# Patient Record
Sex: Female | Born: 1995 | Race: White | Hispanic: No | Marital: Married | Smoking: Never smoker
Health system: Southern US, Community
[De-identification: ages and names within clinical notes are randomized; demographics above are authoritative.]

## PROBLEM LIST (undated history)

## (undated) DIAGNOSIS — D509 Iron deficiency anemia, unspecified: Secondary | ICD-10-CM

## (undated) DIAGNOSIS — G441 Vascular headache, not elsewhere classified: Secondary | ICD-10-CM

## (undated) HISTORY — DX: Iron deficiency anemia, unspecified: D50.9

## (undated) HISTORY — PX: TONSILLECTOMY: SUR1361

## (undated) HISTORY — DX: Vascular headache, not elsewhere classified: G44.1

---

## 2018-01-20 DIAGNOSIS — F3174 Bipolar disorder, in full remission, most recent episode manic: Secondary | ICD-10-CM | POA: Insufficient documentation

## 2019-05-05 DIAGNOSIS — D509 Iron deficiency anemia, unspecified: Secondary | ICD-10-CM | POA: Insufficient documentation

## 2019-05-05 DIAGNOSIS — G441 Vascular headache, not elsewhere classified: Secondary | ICD-10-CM | POA: Insufficient documentation

## 2019-07-31 ENCOUNTER — Encounter: Payer: Self-pay | Admitting: Neurology
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
## 2019-09-12 ENCOUNTER — Encounter: Payer: Self-pay | Admitting: Neurology

## 2019-09-12 ENCOUNTER — Ambulatory Visit (INDEPENDENT_AMBULATORY_CARE_PROVIDER_SITE_OTHER): Payer: 59 | Admitting: Neurology

## 2019-09-12 ENCOUNTER — Other Ambulatory Visit: Payer: Self-pay

## 2019-09-12 VITALS — BP 106/68 | HR 96 | Ht 67.0 in | Wt 171.0 lb

## 2019-09-12 DIAGNOSIS — R519 Headache, unspecified: Secondary | ICD-10-CM | POA: Diagnosis not present

## 2019-09-12 DIAGNOSIS — G44039 Episodic paroxysmal hemicrania, not intractable: Secondary | ICD-10-CM

## 2019-09-12 MED ORDER — SUMATRIPTAN SUCCINATE 100 MG PO TABS: ORAL_TABLET | ORAL | 2 refills | Status: DC

## 2019-09-12 MED ORDER — TOPIRAMATE 50 MG PO TABS: ORAL_TABLET | ORAL | 0 refills | Status: DC

## 2019-09-12 NOTE — Patient Instructions (Addendum)
Medication Instructions:  1.  Start topiramate 50mg  tablet.  Take 1/2 tablet at bedtime for a week, then 1 tablet at bedtime.  Contact me for update and refill and we can increase dose if needed. 2.  At earliest onset of headache, take sumatriptan 100mg .  May repeat dose in 2 hours if needed.  Maximum 2 tablets in 24 hours. 3.  Limit use of pain relievers to no more than 2 days out of week to prevent risk of rebound or medication-overuse headache.  *If you need a refill on your neurolgic medications before your next appointment, please call your pharmacy*  Testing/Procedures:  Will check MRI of brain with and without contrast, once approved through your insurance [facility] will contact you to schedule an appointment   Follow up: Your next appointment:   Can be in office or virtual your choice Your physician recommends that you schedule a follow-up appointment in: 4 months with Dr Tomi Likens   Other: Headaches may be cluster headaches or paroxysmal hemicrania (both are related)   Indomethacin-Responsive Headache, Adult An indomethacin-responsive headache is a headache that gets better when you take indomethacin. Indomethacin is a kind of NSAID (nonsteroidal anti-inflammatory drug). Indomethacin can quickly stop the pain from some kinds of headaches, such as:  Paroxysmal hemicrania. This is a series of short, severe headaches, usually on just one side of the head.  Hemicrania continua. Pain is nonstop and on one side of the face.  Primary exertional headache. Exercise sets off these headaches.  Primary cough headache. Pain may come from pressure in the brain when coughing or straining. What are the causes? The exact cause of this condition is not known. Certain conditions may start (trigger) a headache. They include:  Moving the head in certain ways.  Stress.  Pressure on sensitive areas of the neck.  Drinking alcohol.  Exercise.  Coughing and sneezing. What increases  the risk? The following factors may make you more likely to develop this condition:  Being 75 years of age or older.  Having a serious head injury.  Having migraine headaches.  Having a family history of this condition. What are the signs or symptoms? Symptoms of this condition depend on the kind of headache you have.  Paroxysmal hemicrania: ? Having about 10 headaches a day. Each may last from a few minutes to 2 hours. ? Severe, pounding pain. ? Pain usually on just one side of the head. It often centers around the eye or in the forehead. ? A watery eye, which may become red or swollen. ? A droopy or swollen eyelid. ? Sweating and having a red or pinkish face. ? A stuffy, runny nose.  Hemicrania continua: ? All-day headache. This may occur daily for at least 3 months. Then there may be no headaches for weeks or months. ? Pain that gets worse several times during the day. ? Pain in the face, on one side only. It almost always occurs on the same side. ? A watery eye. It may also become droopy, red, and swollen. ? A stuffy, runny nose. ? Pain that gets worse with sound or light.  Primary exertional headache: ? Pain during physical activity. ? Pounding or throbbing pain. ? Pain that lasts for 5 minutes to 48 hours, or sometimes longer.  Primary cough headache: ? Pain that starts after coughing, sneezing, or straining. ? Sharp, stabbing pain. ? Pain on both sides of the head. It is often worse in the back of the head. ? Pain that  is severe for a few minutes and then dull for several hours. How is this diagnosed? This condition may be diagnosed based on:  Symptoms and medical history. Your health care provider will ask you questions about your headaches.  Physical exam.  Tests that may include: ? Blood and urine tests. ? Spinal tap (lumbar puncture). This tests a sample of fluid from your spine. The test checks for infection, bleeding in your brain (brain hemorrhage), or  extra pressure inside your skull. ? Ultrasound, MRI, CT scan, or other imaging tests. If no medical condition is causing your headaches, you will be given indomethacin. If yours is an indomethacin-responsive headache, your symptoms should go away quickly. How is this treated?  By taking indomethacin.  With other medicines: ? To prevent or treat stomach ulcers. ? To relieve stomachache or heartburn (antacids). ? For nausea. Follow these instructions at home: Lifestyle  Rest in a dark, quiet room.  Put a cool, damp washcloth on your head or face.  Get plenty of sleep. Most adults should get at least 7-9 hours of sleep each night.  Eat on a regular schedule. Do not skip meals.  Limit alcohol intake to no more than 1 drink per day for non-pregnant women and 2 drinks per day for men. One drink equals 12 ounces of beer, 5 ounces of wine, or 1 ounces of hard liquor.  Do not use any products that contain nicotine or tobacco, such as cigarettes and e-cigarettes. If you need help quitting, ask your health care provider. Headache diary Keep a headache diary. This will help you and your health care provider determine what is triggering your headaches. Each time you have a headache, write down:  When it started and stopped. Include the day and time.  How it felt.  Any triggers, such as noise, stress, or foods.  Any medicines you took.  General instructions  Take over-the-counter and prescription medicines only as told by your health care provider. ? Do not take other NSAIDs, such as ibuprofen, with indomethacin.  Tell your health care provider about all medicines you are taking, including vitamins, herbs, eye drops, creams, and over-the-counter medicines.  Keep all follow-up visits as told by your health care provider. This is important. Contact a health care provider if:  Your pain continues even with treatment.  You have nausea.  You have a fever. Get help right away  if:  You have bad stomach pain or vomiting.  You vomit blood.  You have blood in your stool.  You have chest pain.  You have any symptoms of a stroke. "BE FAST" is an easy way to remember the main warning signs of a stroke: ? B - Balance. Signs are dizziness, sudden trouble walking, or loss of balance. ? E - Eyes. Signs are trouble seeing or a sudden change in vision. ? F - Face. Signs are sudden weakness or numbness of the face, or the face or eyelid drooping on one side. ? A - Arms. Signs are weakness or numbness in an arm. This happens suddenly and usually on one side of the body. ? S - Speech. Signs are sudden trouble speaking, slurred speech, or trouble understanding what people say. ? T - Time. Time to call emergency services. Write down what time symptoms started.  You have other signs of a stroke, such as: ? A sudden, severe headache. ? Nausea or vomiting. ? Seizure. Summary  An indomethacin-responsive headache is a headache that gets better when you take  indomethacin, a medicine that stops inflammation.  The exact cause of this condition is not known, but there are certain conditions that may start (trigger) a headache.  Keep a headache diary to help your health care provider determine your triggers.  Treatment of this condition includes indomethacin, but it may include other medicines to relieve other symptoms. This information is not intended to replace advice given to you by your health care provider. Make sure you discuss any questions you have with your health care provider. Document Revised: 08/01/2018 Document Reviewed: 06/19/2017 Elsevier Patient Education  2020 [employer].

## 2019-10-14 ENCOUNTER — Other Ambulatory Visit: Payer: Self-pay

## 2019-10-14 ENCOUNTER — Ambulatory Visit
Admission: RE | Admit: 2019-10-14 | Discharge: 2019-10-14 | Disposition: A | Discharge status: Home / Self Care | Payer: 59 | Source: Ambulatory Visit | Attending: Neurology | Admitting: Neurology

## 2019-10-14 MED ORDER — GADOBENATE DIMEGLUMINE 529 MG/ML IV SOLN
17.0000 mL | Freq: Once | INTRAVENOUS | Status: AC | PRN
  Administered 2019-10-14: 17 mL via INTRAVENOUS
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
## 2020-01-15 ENCOUNTER — Other Ambulatory Visit: Payer: Self-pay

## 2020-01-15 ENCOUNTER — Ambulatory Visit (INDEPENDENT_AMBULATORY_CARE_PROVIDER_SITE_OTHER): Payer: 59 | Admitting: Neurology

## 2020-01-15 ENCOUNTER — Encounter: Payer: Self-pay | Admitting: Neurology

## 2020-01-15 VITALS — BP 111/73 | HR 93 | Ht 67.0 in | Wt 176.0 lb

## 2020-01-15 DIAGNOSIS — G44039 Episodic paroxysmal hemicrania, not intractable: Secondary | ICD-10-CM | POA: Diagnosis not present

## 2020-01-15 DIAGNOSIS — G43009 Migraine without aura, not intractable, without status migrainosus: Secondary | ICD-10-CM

## 2020-01-15 MED ORDER — TOPIRAMATE 50 MG PO TABS: 50.0000 mg | ORAL_TABLET | Freq: Every day | ORAL | 5 refills | Status: DC

## 2020-01-15 MED ORDER — SUMATRIPTAN SUCCINATE 100 MG PO TABS: ORAL_TABLET | ORAL | 5 refills | Status: AC

## 2020-01-15 NOTE — Patient Instructions (Signed)
  1. Continue topiramate 50mg at bedtime 2. Take sumatriptan 100mg at earliest onset of headache.  May repeat dose once in 2 hours if needed.  Maximum 2 tablets in 24 hours. 3. Limit use of pain relievers to no more than 2 days out of the week.  These medications include acetaminophen, NSAIDs (ibuprofen/Advil/Motrin, naproxen/Aleve, triptans (Imitrex/sumatriptan), Excedrin, and narcotics.  This will help reduce risk of rebound headaches. 4. Be aware of common food triggers:  - Caffeine:  coffee, black tea, cola, Mt. Dew  - Chocolate  - Dairy:  aged cheeses (brie, blue, cheddar, gouda, Parmasan, provolone, romano, Swiss, etc), chocolate milk, buttermilk, sour cream, limit eggs and yogurt  - Nuts, peanut butter  - Alcohol  - Cereals/grains:  FRESH breads (fresh bagels, sourdough, doughnuts), yeast productions  - Processed/canned/aged/cured meats (pre-packaged deli meats, hotdogs)  - MSG/glutamate:  soy sauce, flavor enhancer, pickled/preserved/marinated foods  - Sweeteners:  aspartame (Equal, Nutrasweet).  Sugar and Splenda are okay  - Vegetables:  legumes (lima beans, lentils, snow peas, fava beans, pinto peans, peas, garbanzo beans), sauerkraut, onions, olives, pickles  - Fruit:  avocados, bananas, citrus fruit (orange, lemon, grapefruit), mango  - Other:  Frozen meals, macaroni and cheese 5. Routine exercise 6. Stay adequately hydrated (aim for 64 oz water daily) 7. Keep headache diary 8. Maintain proper stress management 9. Maintain proper sleep hygiene 10. Do not skip meals 11. Consider supplements:  magnesium citrate 400mg daily, riboflavin 400mg daily, coenzyme Q10 100mg three times daily.  

## 2020-05-06 ENCOUNTER — Telehealth: Payer: Self-pay | Admitting: Neurology

## 2020-05-06 NOTE — Telephone Encounter (Signed)
Worked in per [place], first avail work in slot 05/22/20 @ 11:30AM

## 2020-05-06 NOTE — Telephone Encounter (Signed)
She needs to be placed at the top of the waitlist for first cancellation.

## 2020-05-06 NOTE — Telephone Encounter (Signed)
Patient is experiencing headaches that aren't like the ones she usually has and the medication isn't helping. She is having balance issues, blurred vision, and she states when she stands back up one side of her nose runs a lot and that's when she has head pain. She also states that she has a metal taste in her mouth. All symptoms have been going on for 3 weeks. Patient wasn't sure if she needs to be seen before her next appt 07/17/20. Placed on waitlist.  Please call.

## 2020-05-06 NOTE — Telephone Encounter (Signed)
Done
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
## 2020-05-09 ENCOUNTER — Encounter: Payer: Self-pay | Admitting: Neurology

## 2020-05-09 ENCOUNTER — Telehealth: Payer: Self-pay

## 2020-05-09 ENCOUNTER — Other Ambulatory Visit: Payer: Self-pay

## 2020-05-09 ENCOUNTER — Ambulatory Visit (INDEPENDENT_AMBULATORY_CARE_PROVIDER_SITE_OTHER): Payer: 59 | Admitting: Neurology

## 2020-05-09 ENCOUNTER — Ambulatory Visit (HOSPITAL_COMMUNITY)
Admission: RE | Admit: 2020-05-09 | Discharge: 2020-05-09 | Disposition: A | Discharge status: Home / Self Care | Payer: 59 | Source: Ambulatory Visit | Attending: Neurology | Admitting: Neurology

## 2020-05-09 VITALS — BP 107/73 | HR 99 | Ht 67.0 in | Wt 181.2 lb

## 2020-05-09 DIAGNOSIS — R51 Headache with orthostatic component, not elsewhere classified: Secondary | ICD-10-CM

## 2020-05-09 DIAGNOSIS — G9601 Cranial cerebrospinal fluid leak, spontaneous: Secondary | ICD-10-CM | POA: Insufficient documentation

## 2020-05-09 NOTE — Telephone Encounter (Signed)
-----   Message from Drema Dallas, DO sent at 05/09/2020  2:43 PM EST ----- The CT does not reveal any CSF leak.  However, it does show evidence of sinusitis which may be the cause of her symptoms.  I would still like to check MRI of the brain with and without contrast to make sure there isn't evidence of a CSF leak elsewhere, but I would definitely have her follow up with her PCP for treatment of the sinusitis.

## 2020-05-09 NOTE — Patient Instructions (Addendum)
1. Start with CT maxillofacial without contrast ASAP -  Further testing pending results 2.  Keep appointment in January

## 2020-05-09 NOTE — Telephone Encounter (Signed)
Pt advise of her CT results. As well as needing a Mri of the Brain w/wO contrast.    Mri Ordered. Message sent to chelsea to start a PA so I can call [facility] to schedule.

## 2020-05-10 NOTE — Telephone Encounter (Signed)
Per Dr. Everlena Cooper results note: 11/18  Pt MRI schedule at [facility] 11/20 at 11 arrive at 10:30 ED enterence

## 2020-05-11 ENCOUNTER — Ambulatory Visit (HOSPITAL_COMMUNITY)
Admission: RE | Admit: 2020-05-11 | Discharge: 2020-05-11 | Disposition: A | Discharge status: Home / Self Care | Payer: 59 | Source: Ambulatory Visit | Attending: Neurology | Admitting: Neurology

## 2020-05-11 ENCOUNTER — Other Ambulatory Visit: Payer: Self-pay

## 2020-05-11 DIAGNOSIS — G9601 Cranial cerebrospinal fluid leak, spontaneous: Secondary | ICD-10-CM | POA: Diagnosis present

## 2020-05-11 MED ORDER — GADOBUTROL 1 MMOL/ML IV SOLN
7.5000 mL | Freq: Once | INTRAVENOUS | Status: AC | PRN
  Administered 2020-05-11: 7.5 mL via INTRAVENOUS

## 2020-05-22 ENCOUNTER — Ambulatory Visit: Payer: 59 | Admitting: Neurology
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
## 2020-07-17 ENCOUNTER — Other Ambulatory Visit: Payer: Self-pay

## 2020-07-17 ENCOUNTER — Encounter: Payer: Self-pay | Admitting: Neurology

## 2020-07-17 ENCOUNTER — Ambulatory Visit (INDEPENDENT_AMBULATORY_CARE_PROVIDER_SITE_OTHER): Payer: 59 | Admitting: Neurology

## 2020-07-17 VITALS — BP 94/64 | HR 87 | Ht 67.0 in | Wt 178.8 lb

## 2020-07-17 DIAGNOSIS — R51 Headache with orthostatic component, not elsewhere classified: Secondary | ICD-10-CM | POA: Diagnosis not present

## 2020-07-17 DIAGNOSIS — J3489 Other specified disorders of nose and nasal sinuses: Secondary | ICD-10-CM | POA: Diagnosis not present

## 2020-07-17 DIAGNOSIS — J324 Chronic pansinusitis: Secondary | ICD-10-CM

## 2020-07-17 DIAGNOSIS — R519 Headache, unspecified: Secondary | ICD-10-CM | POA: Diagnosis not present

## 2020-07-17 DIAGNOSIS — G43009 Migraine without aura, not intractable, without status migrainosus: Secondary | ICD-10-CM

## 2020-07-17 MED ORDER — QULIPTA 60 MG PO TABS: 60.0000 mg | ORAL_TABLET | Freq: Every day | ORAL | 5 refills | Status: AC

## 2020-07-17 NOTE — Patient Instructions (Signed)
1.  Will refer you to ENT 2.  Stop topiramate.  Start Qulipta 1 tablet daily 3.  Continue sumatriptan as needed 4.  Limit use of pain relievers to no more than 2 days out of week to prevent risk of rebound or medication-overuse headache. 5.  Follow up 3 months - further recommendations pending ENT evaluation.

## 2020-07-18 ENCOUNTER — Other Ambulatory Visit: Payer: Self-pay

## 2020-07-18 DIAGNOSIS — J3489 Other specified disorders of nose and nasal sinuses: Secondary | ICD-10-CM

## 2020-08-22 ENCOUNTER — Encounter: Payer: Self-pay | Admitting: Neurology

## 2020-08-22 NOTE — Progress Notes (Signed)
PA and Appeal both denied for the Qulipta medication for patient.   3/3- received fax that patient was approved through the [employer] patient assistance for the free drug Qulipta program. Sent to scanning for chart.
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
## 2020-10-16 ENCOUNTER — Encounter: Payer: Self-pay | Admitting: Neurology

## 2020-10-16 ENCOUNTER — Telehealth (INDEPENDENT_AMBULATORY_CARE_PROVIDER_SITE_OTHER): Payer: 59 | Admitting: Neurology

## 2020-10-16 ENCOUNTER — Other Ambulatory Visit: Payer: Self-pay

## 2020-10-16 DIAGNOSIS — Z3A15 15 weeks gestation of pregnancy: Secondary | ICD-10-CM | POA: Diagnosis not present

## 2020-10-16 DIAGNOSIS — J324 Chronic pansinusitis: Secondary | ICD-10-CM

## 2020-10-16 DIAGNOSIS — G43009 Migraine without aura, not intractable, without status migrainosus: Secondary | ICD-10-CM | POA: Diagnosis not present

## 2020-11-05 ENCOUNTER — Telehealth: Payer: Self-pay | Admitting: Neurology

## 2020-11-05 NOTE — Telephone Encounter (Signed)
Patient states that she has not heard from the ENT office and wants to know the status  Please call

## 2020-11-06 NOTE — Telephone Encounter (Signed)
Not sure what happened. Resent new referral over to [facility] @ [phone].   Will call pt to give her the phone number to call the office to check the status of her referral as well. [phone]. Mychart message sen to pt as well a call.
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
## 2021-04-18 ENCOUNTER — Ambulatory Visit: Payer: 59 | Admitting: Neurology

## 2021-04-18 ENCOUNTER — Encounter: Payer: Self-pay | Admitting: Neurology

## 2022-03-13 IMAGING — CT CT MAXILLOFACIAL W/O CM
3 series · 15 of 47 positions shown, 18 images · non-contrast
Comparison: Brain MRI 10/14/2019.

CLINICAL DATA: Positional headache. Cerebral spinal fluid leak from
nose. Head trauma, CSF leak suspected; cerebral spinal fluid leak
from nose.

EXAM:
CT MAXILLOFACIAL WITHOUT CONTRAST
TECHNIQUE: Multidetector CT images of the paranasal sinuses were obtained using
the standard protocol without intravenous contrast.

[Series 2: max soft · axial · 0.34mm/px · z∈[+1394,+1534]mm · 9 of 82 slices shown, 12 images]
[im 6/82  brain]
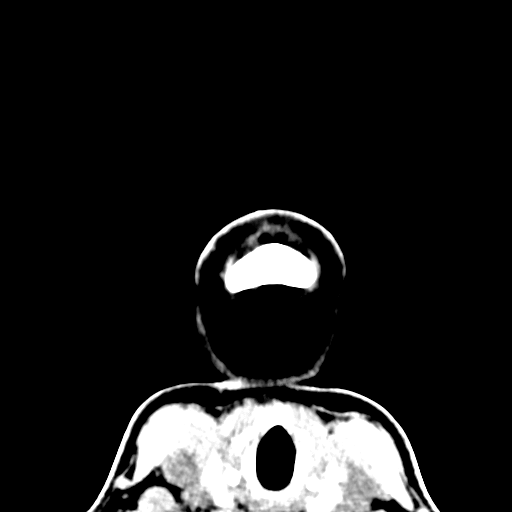
[im 6/82  bone]
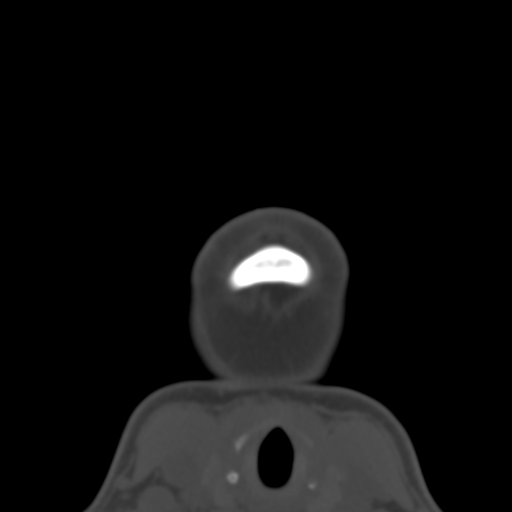
[im 14/82  bone]
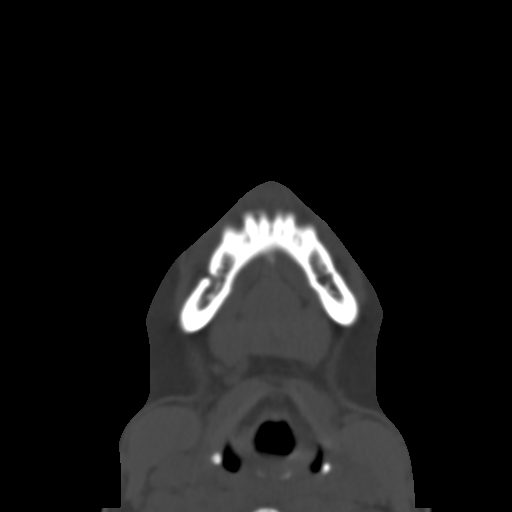
[im 23/82  bone]
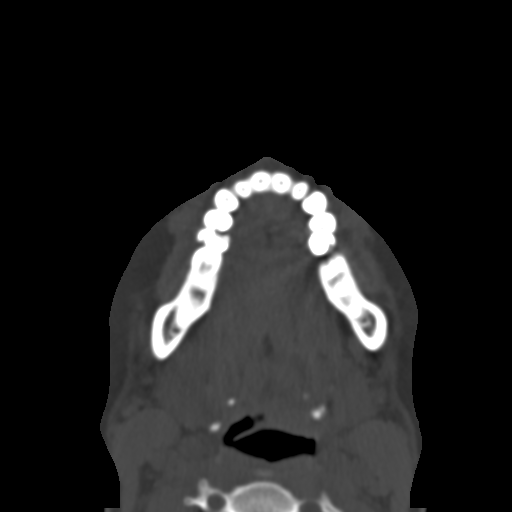
[im 31/82  bone]
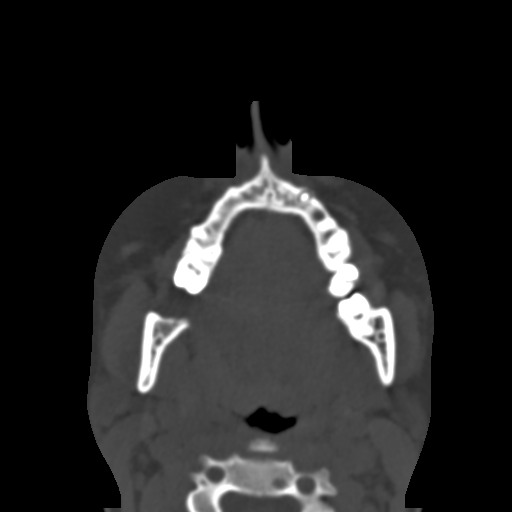
[im 42/82  brain]
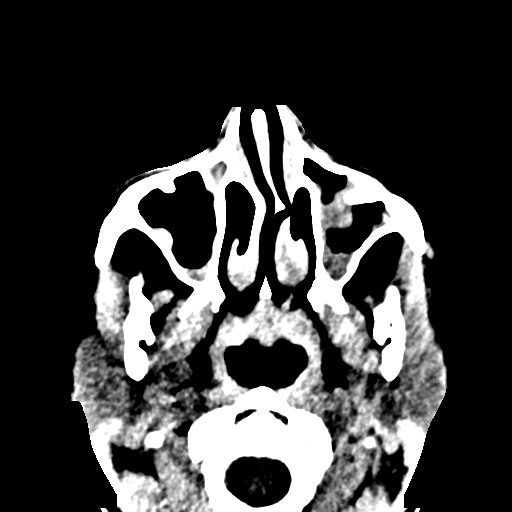
[im 42/82  bone]
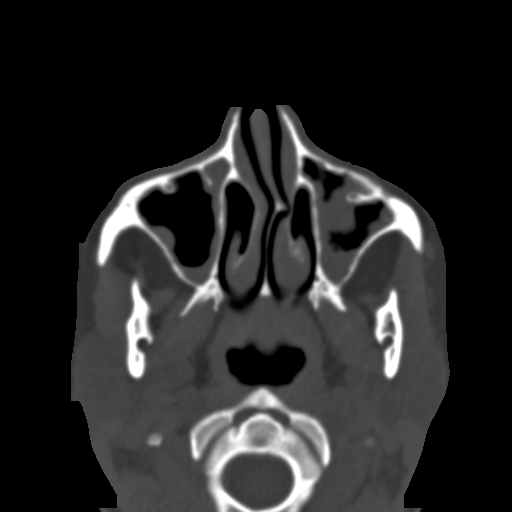
[im 51/82  bone]
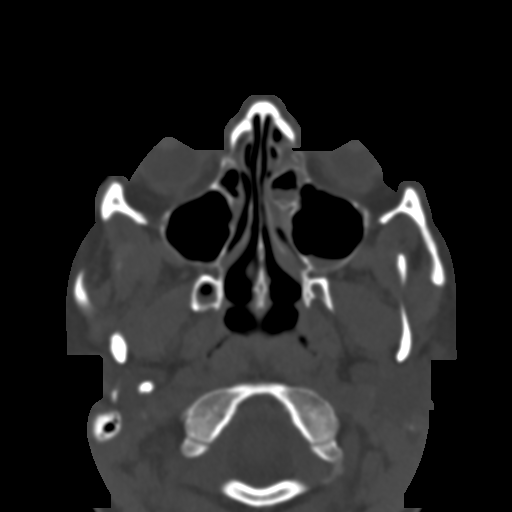
[im 59/82  bone]
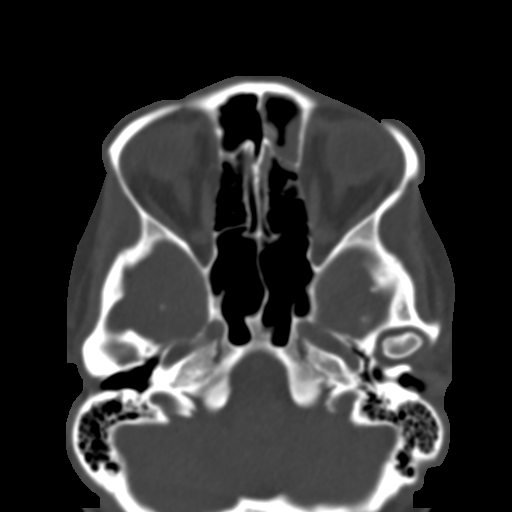
[im 68/82  bone]
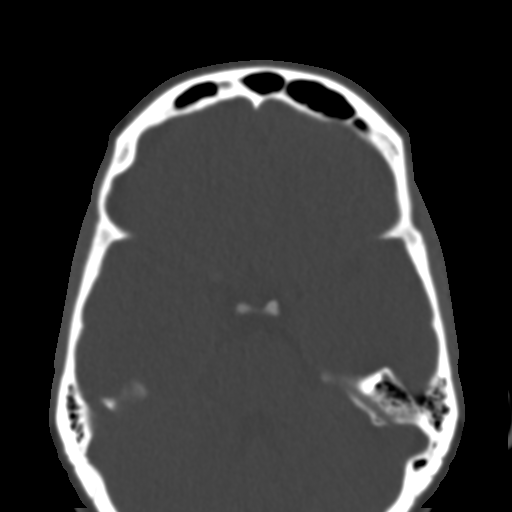
[im 76/82  brain]
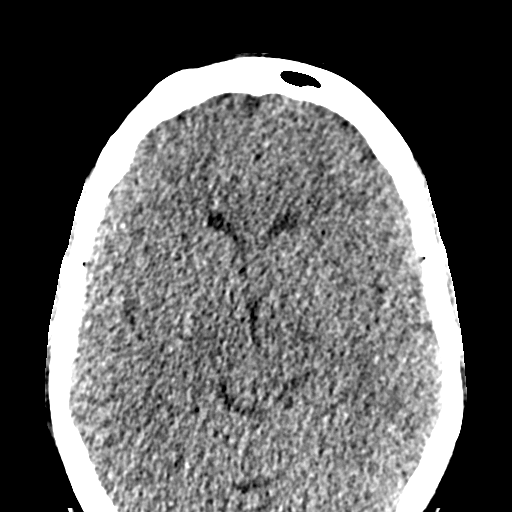
[im 76/82  bone]
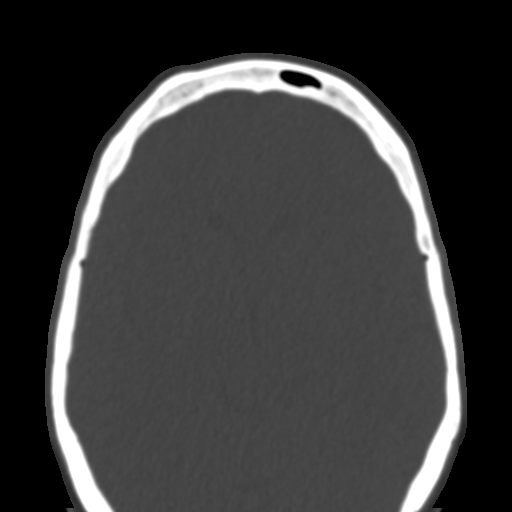

[Series 6: coronal soft · coronal · 0.42mm/px · 3 of 69 slices shown]
[im 23/69  bone]
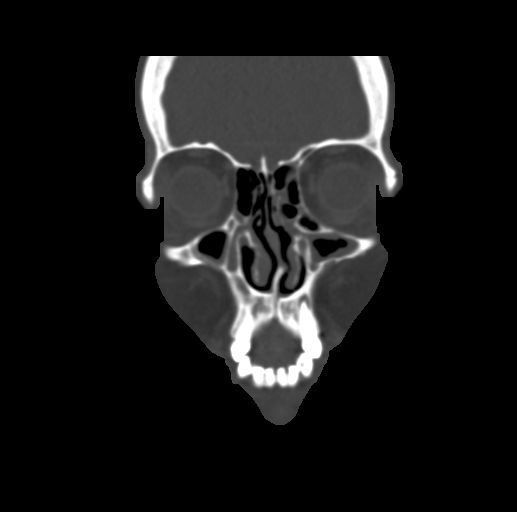
[im 31/69  bone]
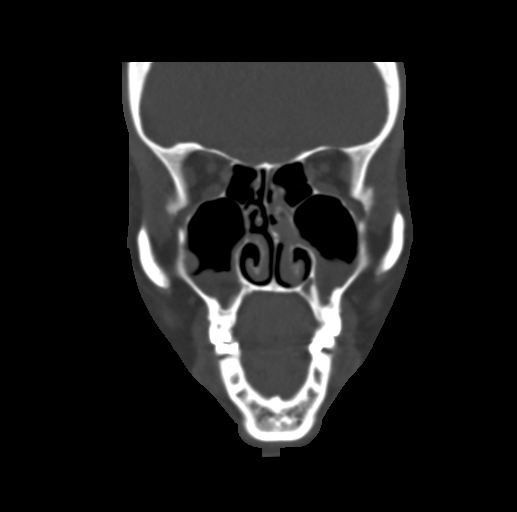
[im 38/69  bone]
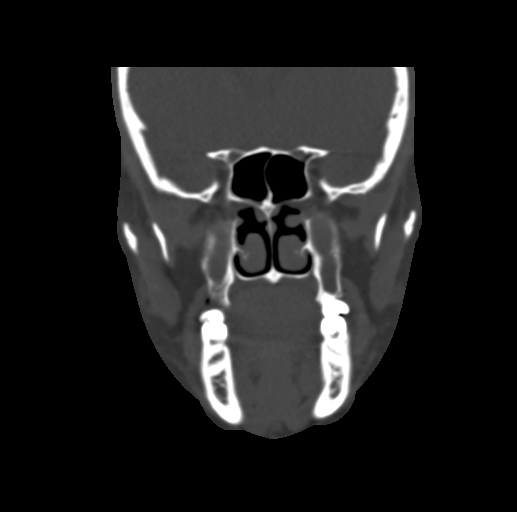

[Series 9: sagittal bone · sagittal · 0.36mm/px · 3 of 72 slices shown]
[im 24/72  bone]
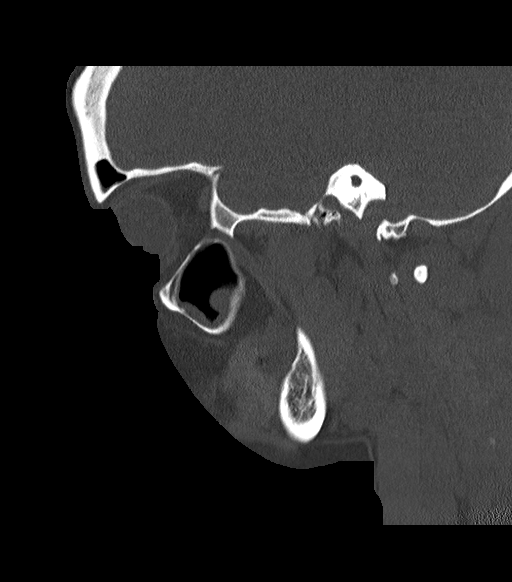
[im 36/72  bone]
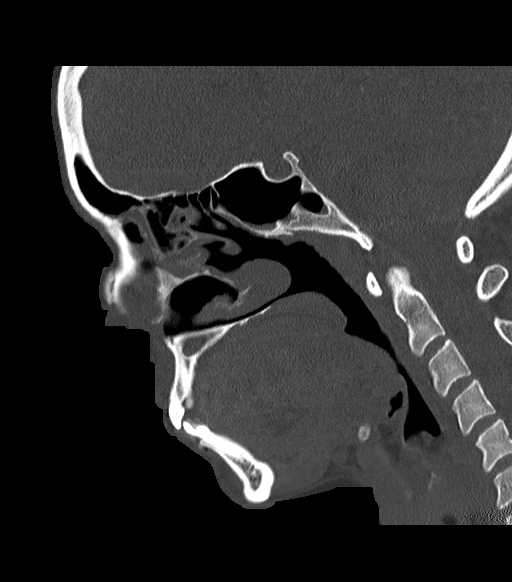
[im 48/72  bone]
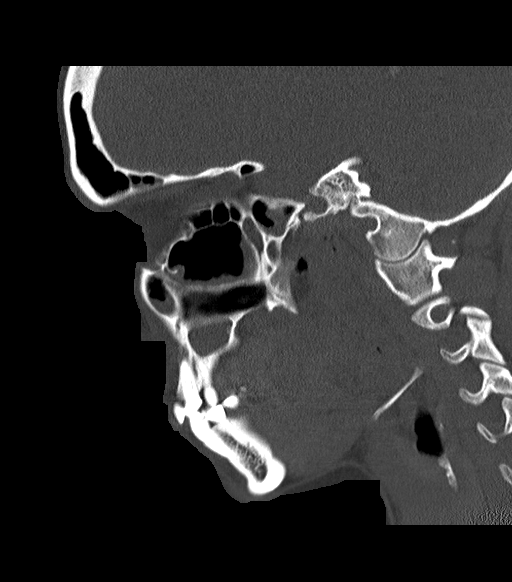

[15 of 47 positions shown; findings below may reference images not displayed]

FINDINGS: Paranasal sinuses:

Frontal: Mucosal thickening bilaterally (left greater than right).
The frontal sinus drainage pathways are opacified by mucosal
thickening and/or secretions.

Ethmoid: Scattered frothy secretions and mild to moderate mucosal
thickening within the bilateral ethmoid air cells.

Maxillary: Moderate mucosal thickening bilaterally. Bilateral
air-fluid levels. Frothy secretions on the left.

Sphenoid: Mild mucosal thickening bilaterally. Air-fluid level on
the right. The right sphenoid sinus ostium is narrowed by mucosal
thickening and/or secretions. Patent sphenoethmoidal recesses.

Right ostiomeatal unit: Obstructed by mucosal thickening and/or
secretions

Left ostiomeatal unit: Obstructed by mucosal thickening and/or
secretions.

Nasal passages: Leftward deviation of the bony nasal septum with
leftward bony spurring. Partial opacification of the left middle
meatus. Polypoid soft tissue within the left middle meatus.
Otherwise mild bilateral mucosal thickening.

Anatomy: Some pneumatization is present superior to the anterior
ethmoid notches. Symmetric and intact olfactory grooves and fovea
ethmoidalis, Keros I (1-3mm). Presellar sphenoid pneumatization
pattern. There is pneumatization of the left anterior clinoid
process.

Other: No skull base dehiscence is identified to suggest a site of
CSF leak.
IMPRESSION: No skull base dehiscence is identified to suggest a site of CSF
leak.

Pansinusitis as described. Disease is greatest within the ethmoid,
maxillary and right sphenoid sinuses. Correlate for acute sinusitis.

Mucosal thickening and/or secretions obstruct the frontal sinus
drainage pathways and ostiomeatal units, and narrow the right
sphenoid sinus ostium.

Leftward deviation of the bony nasal septum with leftward bony
spurring.

Polypoid soft tissue within the left middle meatus. Direct
visualization is recommended.

There is otherwise mild mucosal thickening within both nasal
passages.
